# Patient Record
Sex: Female | Born: 1985
Health system: Southern US, Community
[De-identification: ages and names within clinical notes are randomized; demographics above are authoritative.]

---
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
## 2005-01-02 ENCOUNTER — Other Ambulatory Visit: Admission: RE | Admit: 2005-01-02 | Discharge: 2005-01-02 | Payer: Self-pay | Admitting: Obstetrics & Gynecology

## 2005-03-09 HISTORY — PX: APPENDECTOMY: SHX54

## 2005-05-25 ENCOUNTER — Encounter: Admission: RE | Admit: 2005-05-25 | Discharge: 2005-05-25 | Payer: Self-pay | Admitting: Obstetrics & Gynecology

## 2008-08-24 ENCOUNTER — Emergency Department (HOSPITAL_COMMUNITY): Admission: EM | Admit: 2008-08-24 | Discharge: 2008-08-24 | Payer: Self-pay | Admitting: Emergency Medicine

## 2008-10-10 ENCOUNTER — Emergency Department (HOSPITAL_COMMUNITY): Admission: EM | Admit: 2008-10-10 | Discharge: 2008-10-10 | Payer: Self-pay | Admitting: Emergency Medicine

## 2008-10-10 ENCOUNTER — Ambulatory Visit: Payer: Self-pay | Admitting: Psychiatry

## 2008-10-10 ENCOUNTER — Inpatient Hospital Stay (HOSPITAL_COMMUNITY): Admission: RE | Admit: 2008-10-10 | Discharge: 2008-10-12 | Payer: Self-pay | Admitting: Psychiatry
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
## 2010-06-14 LAB — POCT I-STAT, CHEM 8
BUN: 11 mg/dL (ref 6–23)
Chloride: 104 mEq/L (ref 96–112)
Creatinine, Ser: 0.7 mg/dL (ref 0.4–1.2)
Potassium: 3.9 mEq/L (ref 3.5–5.1)
Sodium: 137 mEq/L (ref 135–145)
TCO2: 23 mmol/L (ref 0–100)

## 2010-06-14 LAB — DIFFERENTIAL
Eosinophils Relative: 0 % (ref 0–5)
Lymphocytes Relative: 17 % (ref 12–46)
Lymphs Abs: 2 10*3/uL (ref 0.7–4.0)
Monocytes Absolute: 0.9 10*3/uL (ref 0.1–1.0)
Monocytes Relative: 8 % (ref 3–12)
Neutro Abs: 8.5 10*3/uL — ABNORMAL HIGH (ref 1.7–7.7)

## 2010-06-14 LAB — URINALYSIS, ROUTINE W REFLEX MICROSCOPIC
Glucose, UA: NEGATIVE mg/dL
Hgb urine dipstick: NEGATIVE
Protein, ur: NEGATIVE mg/dL
Specific Gravity, Urine: 1.017 (ref 1.005–1.030)
pH: 7 (ref 5.0–8.0)

## 2010-06-14 LAB — CBC
HCT: 28.3 % — ABNORMAL LOW (ref 36.0–46.0)
Hemoglobin: 9.3 g/dL — ABNORMAL LOW (ref 12.0–15.0)
RBC: 3.62 MIL/uL — ABNORMAL LOW (ref 3.87–5.11)
RDW: 18.7 % — ABNORMAL HIGH (ref 11.5–15.5)

## 2010-06-14 LAB — ETHANOL: Alcohol, Ethyl (B): <5 mg/dL (ref 0–10)

## 2010-06-16 LAB — DIFFERENTIAL
Basophils Absolute: 0 10*3/uL (ref 0.0–0.1)
Eosinophils Absolute: 0.2 10*3/uL (ref 0.0–0.7)
Eosinophils Relative: 2 % (ref 0–5)
Lymphocytes Relative: 31 % (ref 12–46)
Neutrophils Relative %: 58 % (ref 43–77)

## 2010-06-16 LAB — COMPREHENSIVE METABOLIC PANEL
ALT: 13 U/L (ref 0–35)
AST: 27 U/L (ref 0–37)
CO2: 23 mEq/L (ref 19–32)
Chloride: 105 mEq/L (ref 96–112)
Creatinine, Ser: 0.71 mg/dL (ref 0.4–1.2)
GFR calc Af Amer: >60 mL/min (ref >60)
GFR calc non Af Amer: >60 mL/min (ref >60)
Glucose, Bld: 81 mg/dL (ref 70–99)
Total Bilirubin: 0.4 mg/dL (ref 0.3–1.2)

## 2010-06-16 LAB — CBC
Hemoglobin: 9 g/dL — ABNORMAL LOW (ref 12.0–15.0)
MCV: 79.7 fL (ref 78.0–100.0)
RBC: 3.51 MIL/uL — ABNORMAL LOW (ref 3.87–5.11)
WBC: 8.4 10*3/uL (ref 4.0–10.5)

## 2010-06-16 LAB — URINALYSIS, ROUTINE W REFLEX MICROSCOPIC
Bilirubin Urine: NEGATIVE
Ketones, ur: NEGATIVE mg/dL
Nitrite: NEGATIVE
Urobilinogen, UA: 0.2 mg/dL (ref 0.0–1.0)

## 2010-06-16 LAB — LIPASE, BLOOD: Lipase: 17 U/L (ref 11–59)

## 2012-12-29 LAB — OB RESULTS CONSOLE ANTIBODY SCREEN: Antibody Screen: NEGATIVE

## 2012-12-29 LAB — OB RESULTS CONSOLE GC/CHLAMYDIA
CHLAMYDIA, DNA PROBE: NEGATIVE
Gonorrhea: NEGATIVE

## 2012-12-29 LAB — OB RESULTS CONSOLE RPR: RPR: NONREACTIVE

## 2012-12-29 LAB — OB RESULTS CONSOLE HEPATITIS B SURFACE ANTIGEN: Hepatitis B Surface Ag: NEGATIVE

## 2012-12-29 LAB — OB RESULTS CONSOLE HIV ANTIBODY (ROUTINE TESTING): HIV: NONREACTIVE

## 2012-12-29 LAB — OB RESULTS CONSOLE RUBELLA ANTIBODY, IGM: Rubella: IMMUNE

## 2013-03-09 NOTE — L&D Delivery Note (Signed)
Delivery Note At 1:14 AM a viable female was delivered via Vaginal, Spontaneous Delivery (Presentation:OA ;  ).  APGAR: 9, 9; weight .   Placenta status: Intact, Spontaneous.  Cord: 3 vessels with the following complications: None.  Cord pH: NA  Anesthesia: Epidural Local XYLOCINAE Episiotomy: None Lacerations: FIRST DEGREE Suture Repair: 2.0 chromic Est. Blood Loss (mL): 350CC  Mom to postpartum.  Baby to Couplet care / Skin to Skin.  Raphael Gibney Dani Wallner 08/05/2013, 1:31 AM

## 2013-07-11 LAB — OB RESULTS CONSOLE GBS: STREP GROUP B AG: NEGATIVE

## 2013-08-04 ENCOUNTER — Inpatient Hospital Stay (HOSPITAL_COMMUNITY)
Admission: AD | Admit: 2013-08-04 | Discharge: 2013-08-06 | DRG: 775 | Disposition: A | Discharge status: Home / Self Care | Payer: 59 | Source: Ambulatory Visit | Attending: Obstetrics and Gynecology | Admitting: Obstetrics and Gynecology

## 2013-08-04 ENCOUNTER — Encounter (HOSPITAL_COMMUNITY): Payer: 59 | Admitting: Anesthesiology

## 2013-08-04 ENCOUNTER — Encounter (HOSPITAL_COMMUNITY): Payer: Self-pay | Admitting: *Deleted

## 2013-08-04 ENCOUNTER — Inpatient Hospital Stay (HOSPITAL_COMMUNITY): Payer: 59 | Admitting: Anesthesiology

## 2013-08-04 DIAGNOSIS — Z6841 Body Mass Index (BMI) 40.0 and over, adult: Secondary | ICD-10-CM

## 2013-08-04 DIAGNOSIS — O99214 Obesity complicating childbirth: Secondary | ICD-10-CM

## 2013-08-04 DIAGNOSIS — E669 Obesity, unspecified: Secondary | ICD-10-CM | POA: Diagnosis present

## 2013-08-04 LAB — RPR

## 2013-08-04 LAB — CBC
HCT: 39.3 % (ref 36.0–46.0)
HEMOGLOBIN: 13.3 g/dL (ref 12.0–15.0)
MCH: 31.6 pg (ref 26.0–34.0)
MCHC: 33.8 g/dL (ref 30.0–36.0)
MCV: 93.3 fL (ref 78.0–100.0)
Platelets: 291 10*3/uL (ref 150–400)
RBC: 4.21 MIL/uL (ref 3.87–5.11)
RDW: 14.4 % (ref 11.5–15.5)
WBC: 11.3 10*3/uL — ABNORMAL HIGH (ref 4.0–10.5)

## 2013-08-04 LAB — POCT FERN TEST: POCT FERN TEST: POSITIVE

## 2013-08-04 MED ORDER — LACTATED RINGERS IV SOLN
500.0000 mL | Freq: Once | INTRAVENOUS | Status: AC
  Administered 2013-08-04: 500 mL via INTRAVENOUS

## 2013-08-04 MED ORDER — LIDOCAINE HCL (PF) 1 % IJ SOLN
INTRAMUSCULAR | Status: DC | PRN
  Administered 2013-08-04 (×2): 5 mL

## 2013-08-04 MED ORDER — DIPHENHYDRAMINE HCL 50 MG/ML IJ SOLN: 12.5000 mg | INTRAMUSCULAR | Status: DC | PRN

## 2013-08-04 MED ORDER — LIDOCAINE HCL (PF) 1 % IJ SOLN
30.0000 mL | INTRAMUSCULAR | Status: DC | PRN
  Administered 2013-08-05: 30 mL via SUBCUTANEOUS
  Filled 2013-08-04: qty 30

## 2013-08-04 MED ORDER — OXYTOCIN BOLUS FROM INFUSION
500.0000 mL | INTRAVENOUS | Status: DC
  Administered 2013-08-05: 500 mL via INTRAVENOUS

## 2013-08-04 MED ORDER — CITRIC ACID-SODIUM CITRATE 334-500 MG/5ML PO SOLN: 30.0000 mL | ORAL | Status: DC | PRN

## 2013-08-04 MED ORDER — FLEET ENEMA 7-19 GM/118ML RE ENEM: 1.0000 | ENEMA | RECTAL | Status: DC | PRN

## 2013-08-04 MED ORDER — TERBUTALINE SULFATE 1 MG/ML IJ SOLN: 0.2500 mg | Freq: Once | INTRAMUSCULAR | Status: AC | PRN

## 2013-08-04 MED ORDER — OXYTOCIN 40 UNITS IN LACTATED RINGERS INFUSION - SIMPLE MED: 62.5000 mL/h | INTRAVENOUS | Status: DC

## 2013-08-04 MED ORDER — ACETAMINOPHEN 325 MG PO TABS: 650.0000 mg | ORAL_TABLET | ORAL | Status: DC | PRN

## 2013-08-04 MED ORDER — OXYCODONE-ACETAMINOPHEN 5-325 MG PO TABS: 1.0000 | ORAL_TABLET | ORAL | Status: DC | PRN

## 2013-08-04 MED ORDER — ONDANSETRON HCL 4 MG/2ML IJ SOLN: 4.0000 mg | Freq: Four times a day (QID) | INTRAMUSCULAR | Status: DC | PRN

## 2013-08-04 MED ORDER — IBUPROFEN 600 MG PO TABS: 600.0000 mg | ORAL_TABLET | Freq: Four times a day (QID) | ORAL | Status: DC | PRN

## 2013-08-04 MED ORDER — OXYTOCIN 40 UNITS IN LACTATED RINGERS INFUSION - SIMPLE MED
1.0000 m[IU]/min | INTRAVENOUS | Status: DC
  Administered 2013-08-04: 2 m[IU]/min via INTRAVENOUS
  Filled 2013-08-04: qty 1000

## 2013-08-04 MED ORDER — EPHEDRINE 5 MG/ML INJ
10.0000 mg | INTRAVENOUS | Status: DC | PRN
  Filled 2013-08-04: qty 2
  Filled 2013-08-04: qty 4

## 2013-08-04 MED ORDER — LACTATED RINGERS IV SOLN: 500.0000 mL | INTRAVENOUS | Status: DC | PRN

## 2013-08-04 MED ORDER — PHENYLEPHRINE 40 MCG/ML (10ML) SYRINGE FOR IV PUSH (FOR BLOOD PRESSURE SUPPORT)
80.0000 ug | PREFILLED_SYRINGE | INTRAVENOUS | Status: DC | PRN
  Filled 2013-08-04: qty 2

## 2013-08-04 MED ORDER — EPHEDRINE 5 MG/ML INJ
10.0000 mg | INTRAVENOUS | Status: DC | PRN
  Filled 2013-08-04: qty 2

## 2013-08-04 MED ORDER — LACTATED RINGERS IV SOLN
INTRAVENOUS | Status: DC
  Administered 2013-08-04: 23:00:00 via INTRAVENOUS

## 2013-08-04 MED ORDER — PHENYLEPHRINE 40 MCG/ML (10ML) SYRINGE FOR IV PUSH (FOR BLOOD PRESSURE SUPPORT)
80.0000 ug | PREFILLED_SYRINGE | INTRAVENOUS | Status: DC | PRN
  Filled 2013-08-04: qty 2
  Filled 2013-08-04: qty 10

## 2013-08-04 MED ORDER — FENTANYL 2.5 MCG/ML BUPIVACAINE 1/10 % EPIDURAL INFUSION (WH - ANES)
14.0000 mL/h | INTRAMUSCULAR | Status: DC | PRN
  Administered 2013-08-04: 14 mL/h via EPIDURAL
  Filled 2013-08-04: qty 125

## 2013-08-04 NOTE — Anesthesia Preprocedure Evaluation (Signed)
Anesthesia Evaluation  Patient identified by MRN, date of birth, ID band Patient awake    Reviewed: Allergy & Precautions, H&P , Patient's Chart, lab work & pertinent test results  Airway Mallampati: III  TM Distance: >3 FB Neck ROM: full    Dental   Pulmonary  breath sounds clear to auscultation        Cardiovascular Rhythm:regular Rate:Normal     Neuro/Psych    GI/Hepatic   Endo/Other  Morbid obesity  Renal/GU      Musculoskeletal   Abdominal   Peds  Hematology   Anesthesia Other Findings   Reproductive/Obstetrics (+) Pregnancy                             Anesthesia Physical Anesthesia Plan  ASA: III  Anesthesia Plan: Epidural   Post-op Pain Management:    Induction:   Airway Management Planned:   Additional Equipment:   Intra-op Plan:   Post-operative Plan:   Informed Consent: I have reviewed the patients History and Physical, chart, labs and discussed the procedure including the risks, benefits and alternatives for the proposed anesthesia with the patient or authorized representative who has indicated his/her understanding and acceptance.     Plan Discussed with:   Anesthesia Plan Comments:         Anesthesia Quick Evaluation  

## 2013-08-04 NOTE — Anesthesia Procedure Notes (Signed)
Epidural Patient location during procedure: OB Start time: 08/04/2013 7:28 PM  Staffing Anesthesiologist: Brayton Caves Performed by: anesthesiologist   Preanesthetic Checklist Completed: patient identified, site marked, surgical consent, pre-op evaluation, timeout performed, IV checked, risks and benefits discussed and monitors and equipment checked  Epidural Patient position: sitting Prep: site prepped and draped and DuraPrep Patient monitoring: continuous pulse ox and blood pressure Approach: midline Location: L3-L4 Injection technique: LOR air  Needle:  Needle type: Tuohy  Needle gauge: 17 G Needle length: 9 cm and 9 Needle insertion depth: 7 cm Catheter type: closed end flexible Catheter size: 19 Gauge Catheter at skin depth: 12 cm Test dose: negative  Assessment Events: blood not aspirated, injection not painful, no injection resistance, negative IV test and no paresthesia  Additional Notes Patient identified.  Risk benefits discussed including failed block, incomplete pain control, headache, nerve damage, paralysis, blood pressure changes, nausea, vomiting, reactions to medication both toxic or allergic, and postpartum back pain.  Patient expressed understanding and wished to proceed.  All questions were answered.  Sterile technique used throughout procedure and epidural site dressed with sterile barrier dressing. No paresthesia or other complications noted.The patient did not experience any signs of intravascular injection such as tinnitus or metallic taste in mouth nor signs of intrathecal spread such as rapid motor block. Please see nursing notes for vital signs.

## 2013-08-04 NOTE — MAU Note (Signed)
Panties soaked x2 this morning, pad is damp now.  Also having some mucous d/c

## 2013-08-04 NOTE — H&P (Signed)
Kimberly Barton is a 28 y.o. female presenting at 81.3 with SROM.  Uncomplicated PNC. Maternal Medical History:  Reason for admission: Rupture of membranes.   Contractions: Onset was 1-2 hours ago.   Frequency: irregular.   Perceived severity is mild.    Fetal activity: Perceived fetal activity is normal.    Prenatal complications: no prenatal complications Prenatal Complications - Diabetes: none.    OB History   Grav Para Term Preterm Abortions TAB SAB Ect Mult Living   1              History reviewed. No pertinent past medical history. Past Surgical History  Procedure Laterality Date  . Appendectomy  2007   Family History: family history is not on file. Social History:  reports that she has never smoked. She has never used smokeless tobacco. She reports that she does not drink alcohol or use illicit drugs.   Prenatal Transfer Tool  Maternal Diabetes: No Genetic Screening: Normal Maternal Ultrasounds/Referrals: Normal Fetal Ultrasounds or other Referrals:  None Maternal Substance Abuse:  No Significant Maternal Medications:  None Significant Maternal Lab Results:  Lab values include: Group B Strep negative Other Comments:  None  ROS  Dilation: Fingertip Effacement (%): 80 Station: -3 Exam by:: Elonda Husky RN Blood pressure 127/88, pulse 88, temperature 98.5 F (36.9 C), temperature source Oral, resp. rate 18, height 5' 4.25" (1.632 m), weight 108.863 kg (240 lb). Maternal Exam:  Uterine Assessment: Contraction strength is moderate.  Contraction frequency is irregular.   Abdomen: Patient reports no abdominal tenderness. Fundal height is c/w dates.   Estimated fetal weight is 8.   Fetal presentation: vertex  Introitus: Amniotic fluid character: clear.  Cervix: Finger tip and 80 %   Fetal Exam Fetal State Assessment: Category I - tracings are normal.     Physical Exam  Prenatal labs: ABO, Rh:   Antibody:   Rubella:   RPR:    HBsAg:    HIV:    GBS:      Assessment/Plan: Intrauterine pregnancy at 39.3 with SROM Routine labor and delivery   Juluis Mire 08/04/2013, 11:52 AM

## 2013-08-04 NOTE — Progress Notes (Signed)
Patient ID: Kimberly Barton, female   DOB: 1986-03-08, 28 y.o.   MRN: [id number] On 8 mu or pitocin Cervix 1 cm and 90 % fhr cat one

## 2013-08-05 ENCOUNTER — Encounter (HOSPITAL_COMMUNITY): Payer: Self-pay | Admitting: *Deleted

## 2013-08-05 LAB — CBC
HCT: 35.4 % — ABNORMAL LOW (ref 36.0–46.0)
HEMOGLOBIN: 11.7 g/dL — AB (ref 12.0–15.0)
MCH: 31 pg (ref 26.0–34.0)
MCHC: 33.1 g/dL (ref 30.0–36.0)
MCV: 93.7 fL (ref 78.0–100.0)
Platelets: 266 10*3/uL (ref 150–400)
RBC: 3.78 MIL/uL — ABNORMAL LOW (ref 3.87–5.11)
RDW: 14.4 % (ref 11.5–15.5)
WBC: 14.2 10*3/uL — ABNORMAL HIGH (ref 4.0–10.5)

## 2013-08-05 MED ORDER — BENZOCAINE-MENTHOL 20-0.5 % EX AERO
1.0000 "application " | INHALATION_SPRAY | CUTANEOUS | Status: DC | PRN
  Administered 2013-08-05: 1 via TOPICAL
  Filled 2013-08-05: qty 56

## 2013-08-05 MED ORDER — SENNOSIDES-DOCUSATE SODIUM 8.6-50 MG PO TABS
2.0000 | ORAL_TABLET | ORAL | Status: DC
  Administered 2013-08-05: 2 via ORAL
  Filled 2013-08-05: qty 2

## 2013-08-05 MED ORDER — LANOLIN HYDROUS EX OINT: TOPICAL_OINTMENT | CUTANEOUS | Status: DC | PRN

## 2013-08-05 MED ORDER — TETANUS-DIPHTH-ACELL PERTUSSIS 5-2.5-18.5 LF-MCG/0.5 IM SUSP: 0.5000 mL | Freq: Once | INTRAMUSCULAR | Status: DC

## 2013-08-05 MED ORDER — PRENATAL MULTIVITAMIN CH
1.0000 | ORAL_TABLET | Freq: Every day | ORAL | Status: DC
  Filled 2013-08-05: qty 1

## 2013-08-05 MED ORDER — ZOLPIDEM TARTRATE 5 MG PO TABS: 5.0000 mg | ORAL_TABLET | Freq: Every evening | ORAL | Status: DC | PRN

## 2013-08-05 MED ORDER — OXYCODONE-ACETAMINOPHEN 5-325 MG PO TABS: 1.0000 | ORAL_TABLET | ORAL | Status: DC | PRN

## 2013-08-05 MED ORDER — ONDANSETRON HCL 4 MG/2ML IJ SOLN: 4.0000 mg | INTRAMUSCULAR | Status: DC | PRN

## 2013-08-05 MED ORDER — FLEET ENEMA 7-19 GM/118ML RE ENEM: 1.0000 | ENEMA | Freq: Every day | RECTAL | Status: DC | PRN

## 2013-08-05 MED ORDER — IBUPROFEN 600 MG PO TABS
600.0000 mg | ORAL_TABLET | Freq: Four times a day (QID) | ORAL | Status: DC
  Administered 2013-08-05 – 2013-08-06 (×5): 600 mg via ORAL
  Filled 2013-08-05 (×4): qty 1

## 2013-08-05 MED ORDER — SIMETHICONE 80 MG PO CHEW: 80.0000 mg | CHEWABLE_TABLET | ORAL | Status: DC | PRN

## 2013-08-05 MED ORDER — BISACODYL 10 MG RE SUPP: 10.0000 mg | Freq: Every day | RECTAL | Status: DC | PRN

## 2013-08-05 MED ORDER — DIBUCAINE 1 % RE OINT: 1.0000 "application " | TOPICAL_OINTMENT | RECTAL | Status: DC | PRN

## 2013-08-05 MED ORDER — DIPHENHYDRAMINE HCL 25 MG PO CAPS: 25.0000 mg | ORAL_CAPSULE | Freq: Four times a day (QID) | ORAL | Status: DC | PRN

## 2013-08-05 MED ORDER — WITCH HAZEL-GLYCERIN EX PADS: 1.0000 "application " | MEDICATED_PAD | CUTANEOUS | Status: DC | PRN

## 2013-08-05 MED ORDER — ONDANSETRON HCL 4 MG PO TABS: 4.0000 mg | ORAL_TABLET | ORAL | Status: DC | PRN

## 2013-08-05 NOTE — Lactation Note (Signed)
This note was copied from the chart of Kimberly Barton. Lactation Consultation Note  Patient Name: Kimberly Barton Date: 08/05/2013 Reason for consult: Initial assessment Assisted Mom with latching baby in football hold to left breast. Baby latched easily demonstrating a good rhythmic suck with swallows noted. BF basics reviewed with parents. Lactation brochure left for review. Advised of OP services and support group. Encouraged to call for assist as needed.   Maternal Data Formula Feeding for Exclusion: No Infant to breast within first hour of birth: Yes Has patient been taught Hand Expression?: Yes (per Mom, RN demonstrated hand expression) Does the patient have breastfeeding experience prior to this delivery?: No  Feeding Feeding Type: Breast Fed Length of feed: 10 min  LATCH Score/Interventions Latch: Grasps breast easily, tongue down, lips flanged, rhythmical sucking.  Audible Swallowing: A few with stimulation  Type of Nipple: Everted at rest and after stimulation  Comfort (Breast/Nipple): Soft / non-tender     Hold (Positioning): Assistance needed to correctly position infant at breast and maintain latch. Intervention(s): Breastfeeding basics reviewed;Support Pillows;Position options;Skin to skin  LATCH Score: 8  Lactation Tools Discussed/Used     Consult Status Consult Status: Follow-up Date: 08/06/13 Follow-up type: In-patient    Alfred Levins 08/05/2013, 8:18 PM

## 2013-08-05 NOTE — Anesthesia Postprocedure Evaluation (Signed)
Anesthesia Post Note  Patient: Kimberly Barton  Procedure(s) Performed: * No procedures listed *  Anesthesia type: Epidural  Patient location: Mother/Baby  Post pain: Pain level controlled  Post assessment: Post-op Vital signs reviewed  Last Vitals:  Filed Vitals:   08/05/13 0445  BP: 117/79  Pulse: 99  Temp: 36.7 C  Resp: 18    Post vital signs: Reviewed  Level of consciousness:alert  Complications: No apparent anesthesia complications

## 2013-08-05 NOTE — Progress Notes (Signed)
Post Partum Day zero Subjective: no complaints  Objective: Blood pressure 117/79, pulse 99, temperature 98 F (36.7 C), temperature source Oral, resp. rate 18, height 5' 4.25" (1.632 m), weight 108.863 kg (240 lb), SpO2 97.00%, unknown if currently breastfeeding.  Physical Exam:  General: alert Lochia: appropriate Uterine Fundus: firm Incision: na DVT Evaluation: No evidence of DVT seen on physical exam.   Recent Labs  08/04/13 1050 08/05/13 0609  HGB 13.3 11.7*  HCT 39.3 35.4*    Assessment/Plan: Plan for discharge tomorrow   LOS: 1 day   Juluis Mire 08/05/2013, 9:07 AM

## 2013-08-06 MED ORDER — RHO D IMMUNE GLOBULIN 1500 UNIT/2ML IJ SOSY
300.0000 ug | PREFILLED_SYRINGE | Freq: Once | INTRAMUSCULAR | Status: AC
Start: 2013-08-06 — End: 2013-08-06
  Administered 2013-08-06: 300 ug via INTRAMUSCULAR
  Filled 2013-08-06: qty 2

## 2013-08-06 NOTE — Discharge Summary (Signed)
Obstetric Discharge Summary Reason for Admission: onset of labor Prenatal Procedures: none Intrapartum Procedures: spontaneous vaginal delivery Postpartum Procedures: none Complications-Operative and Postpartum: second degree perineal laceration Hemoglobin  Date Value Ref Range Status  08/05/2013 11.7* 12.0 - 15.0 g/dL Final     HCT  Date Value Ref Range Status  08/05/2013 35.4* 36.0 - 46.0 % Final    Physical Exam:  General: alert Lochia: appropriate Uterine Fundus: firm Incision: healing well DVT Evaluation: No evidence of DVT seen on physical exam.  Discharge Diagnoses: Term Pregnancy-delivered  Discharge Information: Date: 08/06/2013 Activity: pelvic rest Diet: routine Medications: PNV and Ibuprofen Condition: stable Instructions: refer to practice specific booklet Discharge to: home   Newborn Data: Live born female  Birth Weight: 7 lb 10.4 oz (3470 g) APGAR: 9, 9  Home with mother.  Kimberly Barton 08/06/2013, 8:17 AM

## 2013-08-07 LAB — RH IG WORKUP (INCLUDES ABO/RH)
ABO/RH(D): O NEG
Fetal Screen: NEGATIVE
Gestational Age(Wks): 39
UNIT DIVISION: 0

## 2013-08-08 LAB — TYPE AND SCREEN
ABO/RH(D): O NEG
Antibody Screen: POSITIVE
DAT, IgG: NEGATIVE
Unit division: 0
Unit division: 0

## 2014-01-08 ENCOUNTER — Encounter (HOSPITAL_COMMUNITY): Payer: Self-pay | Admitting: *Deleted

## 2017-10-21 ENCOUNTER — Other Ambulatory Visit: Payer: Self-pay | Admitting: Sports Medicine

## 2017-10-21 ENCOUNTER — Ambulatory Visit (INDEPENDENT_AMBULATORY_CARE_PROVIDER_SITE_OTHER): Payer: BLUE CROSS/BLUE SHIELD | Admitting: Sports Medicine

## 2017-10-21 ENCOUNTER — Ambulatory Visit (INDEPENDENT_AMBULATORY_CARE_PROVIDER_SITE_OTHER): Payer: BLUE CROSS/BLUE SHIELD

## 2017-10-21 ENCOUNTER — Encounter: Payer: Self-pay | Admitting: Sports Medicine

## 2017-10-21 DIAGNOSIS — S92502A Displaced unspecified fracture of left lesser toe(s), initial encounter for closed fracture: Secondary | ICD-10-CM

## 2017-10-21 DIAGNOSIS — M79672 Pain in left foot: Secondary | ICD-10-CM

## 2017-10-21 NOTE — Progress Notes (Signed)
Subjective: Kimberly Barton is a 32 y.o. female patient who presents to office for evaluation of Left foot pain. Patient complains of progressive pain especially over the last 3 weeks in the Left foot at the  3-5 toes and MTPJs. Admits stubbing foot and reports that pain is worse with pressure to ball and with bending of toes. Patient has tried motrin with no relief in symptoms. Patient denies any other pedal complaints.   Review of Systems  Musculoskeletal: Positive for joint pain.  All other systems reviewed and are negative.  Patient Active Problem List   Diagnosis Date Noted  . Spontaneous vaginal delivery 08/05/2013    Class: Status post  . Indication for care in labor or delivery 08/04/2013    No current outpatient medications on file prior to visit.   No current facility-administered medications on file prior to visit.     No Known Allergies  Objective:  General: Alert and oriented x3 in no acute distress  Dermatology: No open lesions bilateral lower extremities, no webspace macerations, no ecchymosis bilateral, all nails x 10 are well manicured.  Vascular: Focal edema noted to right/left foot. Dorsalis Pedis and Posterior Tibial pedal pulses 2/4, Capillary Fill Time 3 seconds,(+) pedal hair growth bilateral, Temperature gradient within normal limits.  Neurology: Michaell CowingGross sensation intact via light touch bilateral.  Musculoskeletal: There is tenderness with palpation at 4th toe and lesser 3-5 MTPJs, All joint range of motion is within normal limits with mild guarding at MTPJs on left, Strength within normal limits in all groups bilateral.   Gait: Unassisted, Mildly Antalgic gait  Xrays  Left Foot   Impression:Normal mineralization, there is a fracture at 4th proximal phalanx without displacement or comunition, mild soft tissue swelling, no other acute findings.  Assessment and Plan: Problem List Items Addressed This Visit    None    Visit Diagnoses    Fracture of fourth  toe, left, closed, initial encounter    -  Primary   Left foot pain       Relevant Orders   DG Foot Complete Left       -Complete examination performed -Xrays reviewed -Discussed treatement options for fracture; risks, alternatives, and benefits explained. -Applied Toe splint left 4th toe -Dispensed post op shoe to patient to wear at all times and instructed on use -Recommend protection, rest, ice, elevation daily until symptoms improve -May continue with Motrin PRN -Patient to return to office in 4 weeks for serial x-rays to assess healing  or sooner if condition worsens.  Asencion Islamitorya Elcie Pelster, DPM

## 2017-10-21 NOTE — Progress Notes (Signed)
   Subjective:    Patient ID: Kimberly BeltHannah G Odea, female    DOB: 12/30/1985, 32 y.o.   MRN: [id number]  HPI    Review of Systems  Musculoskeletal: Positive for arthralgias and myalgias.  All other systems reviewed and are negative.      Objective:   Physical Exam        Assessment & Plan:

## 2017-10-22 ENCOUNTER — Ambulatory Visit: Payer: Self-pay | Admitting: Sports Medicine

## 2017-11-19 ENCOUNTER — Ambulatory Visit: Payer: BLUE CROSS/BLUE SHIELD | Admitting: Sports Medicine

## 2017-12-16 DIAGNOSIS — E860 Dehydration: Secondary | ICD-10-CM | POA: Diagnosis not present

## 2017-12-16 DIAGNOSIS — R109 Unspecified abdominal pain: Secondary | ICD-10-CM | POA: Diagnosis not present

## 2017-12-16 DIAGNOSIS — R1084 Generalized abdominal pain: Secondary | ICD-10-CM | POA: Diagnosis not present

## 2017-12-17 DIAGNOSIS — Z6826 Body mass index (BMI) 26.0-26.9, adult: Secondary | ICD-10-CM | POA: Diagnosis not present

## 2017-12-17 DIAGNOSIS — A059 Bacterial foodborne intoxication, unspecified: Secondary | ICD-10-CM | POA: Diagnosis not present

## 2018-05-05 DIAGNOSIS — N760 Acute vaginitis: Secondary | ICD-10-CM | POA: Diagnosis not present

## 2018-05-05 DIAGNOSIS — Z803 Family history of malignant neoplasm of breast: Secondary | ICD-10-CM | POA: Diagnosis not present

## 2018-05-05 DIAGNOSIS — R946 Abnormal results of thyroid function studies: Secondary | ICD-10-CM | POA: Diagnosis not present

## 2018-05-05 DIAGNOSIS — Z113 Encounter for screening for infections with a predominantly sexual mode of transmission: Secondary | ICD-10-CM | POA: Diagnosis not present

## 2018-05-05 DIAGNOSIS — Z6826 Body mass index (BMI) 26.0-26.9, adult: Secondary | ICD-10-CM | POA: Diagnosis not present

## 2018-05-05 DIAGNOSIS — Z800 Family history of malignant neoplasm of digestive organs: Secondary | ICD-10-CM | POA: Diagnosis not present

## 2018-05-05 DIAGNOSIS — Z01419 Encounter for gynecological examination (general) (routine) without abnormal findings: Secondary | ICD-10-CM | POA: Diagnosis not present

## 2019-07-28 ENCOUNTER — Other Ambulatory Visit: Payer: Self-pay | Admitting: Gastroenterology

## 2019-07-28 DIAGNOSIS — R109 Unspecified abdominal pain: Secondary | ICD-10-CM

## 2019-07-28 DIAGNOSIS — K6389 Other specified diseases of intestine: Secondary | ICD-10-CM

## 2019-08-08 ENCOUNTER — Other Ambulatory Visit: Payer: Self-pay | Admitting: Gastroenterology

## 2019-08-10 ENCOUNTER — Ambulatory Visit
Admission: RE | Admit: 2019-08-10 | Discharge: 2019-08-10 | Disposition: A | Discharge status: Home / Self Care | Payer: 59 | Source: Ambulatory Visit | Attending: Gastroenterology | Admitting: Gastroenterology

## 2019-08-10 ENCOUNTER — Encounter: Payer: Self-pay | Admitting: Radiology

## 2019-08-10 DIAGNOSIS — R109 Unspecified abdominal pain: Secondary | ICD-10-CM

## 2019-08-10 DIAGNOSIS — K6389 Other specified diseases of intestine: Secondary | ICD-10-CM

## 2019-08-10 MED ORDER — IOPAMIDOL (ISOVUE-300) INJECTION 61%
100.0000 mL | Freq: Once | INTRAVENOUS | Status: AC | PRN
  Administered 2019-08-10: 100 mL via INTRAVENOUS
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
## 2022-11-12 ENCOUNTER — Ambulatory Visit: Payer: BLUE CROSS/BLUE SHIELD | Admitting: Allergy and Immunology

## 2023-03-22 ENCOUNTER — Encounter: Payer: Self-pay | Admitting: Allergy and Immunology

## 2023-03-22 ENCOUNTER — Ambulatory Visit (INDEPENDENT_AMBULATORY_CARE_PROVIDER_SITE_OTHER): Payer: Commercial Managed Care - HMO | Admitting: Allergy and Immunology

## 2023-03-22 VITALS — BP 126/72 | HR 77 | Resp 16 | Ht 66.0 in | Wt 206.0 lb

## 2023-03-22 DIAGNOSIS — R1011 Right upper quadrant pain: Secondary | ICD-10-CM | POA: Diagnosis not present

## 2023-03-22 NOTE — Progress Notes (Signed)
 [place] - [place] - [place] - [place] - [place]   Dear Dr. Fernand,  Thank you for referring Kimberly Barton to the [facility]  on 03/22/2023.   Below is a summation of this patient's evaluation and recommendations.  Thank you for your referral. I will keep you informed about this patient's response to treatment.   If you have any questions please do not hesitate to contact me.   Sincerely,  Camellia DOROTHA Denis, MD Allergy / Immunology [facility] [place]    ______________________________________________________________________    NEW PATIENT NOTE  Referring Provider: Fernand Money, MD Primary Provider: Fernand Tracey LABOR, MD Date of office visit: 03/22/2023    Subjective:   Chief Complaint:  Kimberly Barton (DOB: 13-Mar-1985) is a 37 y.o. female who presents to the clinic on 03/22/2023 with a chief complaint of Possible Food Allergy .     HPI: Kimberly Barton presents to this clinic in the evaluation of possible food allergy.  Her history dates back to 2020 at which point in time she developed right lateral chest pain just underneath her ribs that was relatively sharp and consistent for which she underwent extensive GI evaluation and this issue was felt to be secondary to stool burden for which she utilized multiple laxatives and stool softeners and that issue resolved.  Then, this summer, she had about 3 months of the exact same pain that fortunately ended in September 2024.  Once again this was a continuous pain that sometimes would wake her up at night.  There was no associated systemic or constitutional symptoms.  There was no associated nausea or diarrhea.  She once again underwent extensive evaluation with GI and also had a urologic evaluation and the etiology of this issue was unknown.  She does not really have any atopic disease or hypersensitivity directed against consumption of food.  She does have a  history of migraine that can sometimes be triggered off by certain foods such as cheese consumption.  History reviewed. No pertinent past medical history.  Past Surgical History:  Procedure Laterality Date   APPENDECTOMY  2007    Allergies as of 03/22/2023       Reactions   Imitrex [sumatriptan]    Joint pain        Medication List      none  Review of systems negative except as noted in HPI / PMHx or noted below:  Review of Systems  Constitutional: Negative.   HENT: Negative.    Eyes: Negative.   Respiratory: Negative.    Cardiovascular: Negative.   Gastrointestinal: Negative.   Genitourinary: Negative.   Musculoskeletal: Negative.   Skin: Negative.   Neurological: Negative.   Endo/Heme/Allergies: Negative.   Psychiatric/Behavioral: Negative.      History reviewed. No pertinent family history.  Social History   Socioeconomic History   Marital status: Single    Spouse name: Not on file   Number of children: Not on file   Years of education: Not on file   Highest education level: Not on file  Occupational History   Not on file  Tobacco Use   Smoking status: Never   Smokeless tobacco: Never  Substance and Sexual Activity   Alcohol use: No   Drug use: No   Sexual activity: Yes  Other Topics Concern   Not on file  Social History Narrative   Not on file   Environmental and Social history  Lives in  a house with a dry environment, no animals located inside the household, no carpet in the bedroom, plastic on the bed, no plastic on the pillow, and no smoking ongoing inside the household.  She works in a office setting.  Objective:   Vitals:   03/22/23 1444  BP: 126/72  Pulse: 77  Resp: 16  SpO2: 98%   Height: 5' 6 (167.6 cm) Weight: 206 lb (93.4 kg)  Physical Exam Constitutional:      Appearance: She is not diaphoretic.  HENT:     Head: Normocephalic.     Right Ear: Tympanic membrane, ear canal and external ear normal.     Left Ear:  Tympanic membrane, ear canal and external ear normal.     Nose: Nose normal. No mucosal edema or rhinorrhea.     Mouth/Throat:     Pharynx: Uvula midline. No oropharyngeal exudate.  Eyes:     Conjunctiva/sclera: Conjunctivae normal.  Neck:     Thyroid: No thyromegaly.     Trachea: Trachea normal. No tracheal tenderness or tracheal deviation.  Cardiovascular:     Rate and Rhythm: Normal rate and regular rhythm.     Heart sounds: Normal heart sounds, S1 normal and S2 normal. No murmur heard. Pulmonary:     Effort: No respiratory distress.     Breath sounds: Normal breath sounds. No stridor. No wheezing or rales.  Lymphadenopathy:     Head:     Right side of head: No tonsillar adenopathy.     Left side of head: No tonsillar adenopathy.     Cervical: No cervical adenopathy.  Skin:    Findings: No erythema or rash.     Nails: There is no clubbing.  Neurological:     Mental Status: She is alert.     Diagnostics: Allergy skin tests were not performed.   Results of CT scan abdomen/pelvis obtained 10 August 2019 identified the following:  Lower Chest: No acute findings.  Hepatobiliary: No hepatic masses identified. Gallbladder is unremarkable. No evidence of biliary ductal dilatation.  Pancreas:  No mass or inflammatory changes.  Spleen: Within normal limits in size and appearance.  Adrenals/Urinary Tract: No masses identified. No evidence of ureteral calculi or hydronephrosis.  Stomach/Bowel: No evidence of obstruction, inflammatory process orf abnormal fluid collections. Large colonic stool burden noted.  Vascular/Lymphatic: No pathologically enlarged lymph nodes. No abdominal aortic aneurysm.  Reproductive:  No mass or other significant abnormality.  Other:  None.  Musculoskeletal:  No suspicious bone lesions identified.   Assessment and Plan:    1. Right upper quadrant abdominal pain    Kimberly Barton has had 2 episodes of prolonged right lateral abdominal/chest pain with unknown  etiologic agent with her most recent bout occurring late summer 2024.  I question whether this is secondary to diaphragmatic spasm or irritation or possibly a colonic irritation although certainly there does not appear to be a fair amount of inflammation within her abdominal viscera or diaphragm based upon her previous CT scan results.  I doubt that this is atopic in nature.  I am going to review all of the studies that have been performed by [facility] gastroenterology and at [facility] and we will make a decision about whether or not further evaluation and treatment is required based upon what has been performed to date.  I will contact her once I have an opportunity to review those studies.  Camellia DOROTHA Denis, MD Allergy / Immunology [facility] [place] 

## 2023-03-23 ENCOUNTER — Encounter: Payer: Self-pay | Admitting: Allergy and Immunology
